# Patient Record
Sex: Female | Born: 1960 | Race: White | Hispanic: No | Marital: Married | Smoking: Never smoker
Health system: Southern US, Community
[De-identification: ages and names within clinical notes are randomized; demographics above are authoritative.]

---
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
## 2000-06-19 ENCOUNTER — Inpatient Hospital Stay (HOSPITAL_COMMUNITY): Admission: AD | Admit: 2000-06-19 | Discharge: 2000-06-22 | Payer: Self-pay | Admitting: Obstetrics and Gynecology

## 2000-07-01 ENCOUNTER — Encounter: Admission: RE | Admit: 2000-07-01 | Discharge: 2000-07-31 | Payer: Self-pay | Admitting: Obstetrics and Gynecology

## 2000-07-31 ENCOUNTER — Other Ambulatory Visit: Admission: RE | Admit: 2000-07-31 | Discharge: 2000-07-31 | Payer: Self-pay | Admitting: Obstetrics and Gynecology

## 2001-10-16 ENCOUNTER — Other Ambulatory Visit: Admission: RE | Admit: 2001-10-16 | Discharge: 2001-10-16 | Payer: Self-pay | Admitting: Obstetrics and Gynecology

## 2002-11-13 ENCOUNTER — Other Ambulatory Visit: Admission: RE | Admit: 2002-11-13 | Discharge: 2002-11-13 | Payer: Self-pay | Admitting: Obstetrics and Gynecology

## 2003-02-11 ENCOUNTER — Other Ambulatory Visit: Admission: RE | Admit: 2003-02-11 | Discharge: 2003-02-11 | Payer: Self-pay | Admitting: Obstetrics and Gynecology

## 2003-09-09 ENCOUNTER — Other Ambulatory Visit: Admission: RE | Admit: 2003-09-09 | Discharge: 2003-09-09 | Payer: Self-pay | Admitting: Obstetrics and Gynecology

## 2003-12-21 ENCOUNTER — Other Ambulatory Visit: Admission: RE | Admit: 2003-12-21 | Discharge: 2003-12-21 | Payer: Self-pay | Admitting: Obstetrics and Gynecology

## 2004-07-05 ENCOUNTER — Other Ambulatory Visit: Admission: RE | Admit: 2004-07-05 | Discharge: 2004-07-05 | Payer: Self-pay | Admitting: Obstetrics and Gynecology

## 2005-01-15 ENCOUNTER — Other Ambulatory Visit: Admission: RE | Admit: 2005-01-15 | Discharge: 2005-01-15 | Payer: Self-pay | Admitting: Obstetrics and Gynecology

## 2006-02-05 ENCOUNTER — Encounter: Admission: RE | Admit: 2006-02-05 | Discharge: 2006-02-05 | Payer: Self-pay | Admitting: Family Medicine

## 2006-03-11 ENCOUNTER — Encounter: Admission: RE | Admit: 2006-03-11 | Discharge: 2006-03-11 | Payer: Self-pay | Admitting: Family Medicine

## 2008-01-02 HISTORY — PX: CHOLECYSTECTOMY: SHX55

## 2008-09-02 ENCOUNTER — Ambulatory Visit (HOSPITAL_COMMUNITY): Admission: RE | Admit: 2008-09-02 | Discharge: 2008-09-02 | Payer: Self-pay | Admitting: Family Medicine

## 2008-11-17 ENCOUNTER — Encounter: Admission: RE | Admit: 2008-11-17 | Discharge: 2008-11-17 | Payer: Self-pay | Admitting: Surgery

## 2008-11-24 ENCOUNTER — Encounter (INDEPENDENT_AMBULATORY_CARE_PROVIDER_SITE_OTHER): Payer: Self-pay | Admitting: *Deleted

## 2008-12-02 ENCOUNTER — Encounter: Admission: RE | Admit: 2008-12-02 | Discharge: 2008-12-02 | Payer: Self-pay | Admitting: Gastroenterology

## 2009-03-18 ENCOUNTER — Encounter: Payer: Self-pay | Admitting: Internal Medicine

## 2009-06-06 ENCOUNTER — Ambulatory Visit: Payer: Self-pay | Admitting: Internal Medicine

## 2009-06-06 DIAGNOSIS — R000 Tachycardia, unspecified: Secondary | ICD-10-CM | POA: Insufficient documentation

## 2009-06-29 ENCOUNTER — Ambulatory Visit (HOSPITAL_COMMUNITY): Admission: RE | Admit: 2009-06-29 | Discharge: 2009-06-29 | Payer: Self-pay | Admitting: Internal Medicine

## 2009-06-29 ENCOUNTER — Ambulatory Visit: Payer: Self-pay

## 2009-06-29 ENCOUNTER — Ambulatory Visit: Payer: Self-pay | Admitting: Cardiology

## 2009-09-02 ENCOUNTER — Ambulatory Visit: Payer: Self-pay | Admitting: Vascular Surgery

## 2009-09-02 ENCOUNTER — Ambulatory Visit (HOSPITAL_COMMUNITY): Admission: RE | Admit: 2009-09-02 | Discharge: 2009-09-02 | Payer: Self-pay | Admitting: Physician Assistant

## 2009-09-07 ENCOUNTER — Telehealth: Payer: Self-pay | Admitting: Internal Medicine

## 2010-01-31 NOTE — Progress Notes (Signed)
Summary: [facility] Office Visit Note   [facility] Office Visit Note   Imported By: Roderic Ovens 07/18/2009 16:13:52  _____________________________________________________________________  External Attachment:    Type:   Image     Comment:   External Document

## 2010-01-31 NOTE — Assessment & Plan Note (Signed)
Summary: nep/tachycardia   Visit Type:  Initial Consult  CC:  new patient with tachycardia.  Marland Kitchen  History of Present Illness: Erica Robbins is a pleasant 50 yo WF without significant cardiac history who now presents for cardiology consultation regarding tachycardia.  She reports having heart rates 100-105 bpm in December with associated mild hypertension.  She denies changes in diet, medicines, or any known precipitants.  She had resoluction of tachcyardia/ hypertension until later march/ early april when she again had HR 90s-110s.  She reports rare heart "pounding" but denies other symptoms.  Since mid April, her heart rates have returned to normal.  The patient denies symptoms of  chest pain, shortness of breath, orthopnea, PND, lower extremity edema, dizziness, presyncope, syncope, or neurologic sequela. The patient is tolerating medications without difficulties and is otherwise without complaint today.   Current Medications (verified): 1)  One-A-Day Weight Smart Advance  Tabs (Multiple Vitamins-Minerals) .... Take One Tablet Once Daily 2)  Caltrate 600+d 600-400 Mg-Unit Tabs (Calcium Carbonate-Vitamin D) .... Take One Tablet Once Daily 3)  Buspirone Hcl 10 Mg Tabs (Buspirone Hcl) .... Take One Tablet Two Times A Day As Needed For Heart Rate 4)  Culturelle Digestive Health  Caps (Lactobacillus-Inulin) .... Once Daily 5)  Femhrt Low Dose 0.5-2.5 Mg-Mcg Tabs (Norethindrone-Eth Estradiol) .... Take One Tablet By Mouth Once Daily  Allergies (verified): 1)  ! Sulfa  Past History:  Past Medical History: menopause allergies  Past Surgical History: cholecystectomy  Family History: father had CABG age 32s,  CVA/CVD mother has HTN  Social History: lives in [place].  Denies TED  Review of Systems       All systems are reviewed and negative except as listed in the HPI.   Vital Signs:  Patient profile:   50 year old female Height:      62 inches Weight:      110 pounds BMI:      20.19 Pulse rate:   80 / minute Pulse rhythm:   regular BP sitting:   112 / 76  (left arm) Cuff size:   regular  Vitals Entered By: Judithe Modest CMA (June 06, 2009 10:58 AM)  Physical Exam  General:  Well developed, well nourished, in no acute distress. Head:  normocephalic and atraumatic Eyes:  PERRLA/EOM intact; conjunctiva and lids normal. Nose:  no deformity, discharge, inflammation, or lesions Mouth:  Teeth, gums and palate normal. Oral mucosa normal. Neck:  Neck supple, no JVD. No masses, thyromegaly or abnormal cervical nodes. Lungs:  Clear bilaterally to auscultation and percussion. Heart:  Non-displaced PMI, chest non-tender; regular rate and rhythm, S1, S2 without murmurs, rubs or gallops. Carotid upstroke normal, no bruit. Normal abdominal aortic size, no bruits. Femorals normal pulses, no bruits. Pedals normal pulses. No edema, no varicosities. Abdomen:  Bowel sounds positive; abdomen soft and non-tender without masses, organomegaly, or hernias noted. No hepatosplenomegaly. Msk:  Back normal, normal gait. Muscle strength and tone normal. Pulses:  pulses normal in all 4 extremities Extremities:  No clubbing or cyanosis. Neurologic:  Alert and oriented x 3. Skin:  Intact without lesions or rashes. Cervical Nodes:  no significant adenopathy Psych:  mildly anxious   EKG  Procedure date:  06/06/2009  Findings:      sinus rhythm 80 bpm, PR 114, RsR', LAD, otherwise normal ekg  Impression & Recommendations:  Problem # 1:  UNSPECIFIED TACHYCARDIA (ICD-785.0) The patient presents for evaluation of mild sinus tachycardia and elevated BP.  These findings were present  in December after the second stroke of her father and associated with "stress".  She also reports similar findings in early march. Presently, her EKG reveals sinus rhythm.  I suspect that she has reactive/ situational sinus tachycardia.  Her history is not suggestive of an arrhythmia. I have reviewed CBC, TSH,  and Lipids from PCP's office which are normal. She will maintain adequate hydration and begin regular exercise We will obtain an echocardiogram to evaluate for structural heart disease.  No further workup is planned unless symptoms return or echo is abnl.  Other Orders: Echocardiogram (Echo)  Patient Instructions: 1)  Your physician recommends that you schedule a follow-up appointment in: as needed if echo normal 2)  Your physician has requested that you have an echocardiogram.  Echocardiography is a painless test that uses sound waves to create images of your heart. It provides your doctor with information about the size and shape of your heart and how well your heart's chambers and valves are working.  This procedure takes approximately one hour. There are no restrictions for this procedure.

## 2010-01-31 NOTE — Progress Notes (Signed)
Summary: c/o b/p / pulse x 3 weeks  Phone Note Call from Patient Call back at [facility] Phone [phone] Call back at Work Phone [phone]   Caller: Patient Reason for Call: Talk to Nurse Summary of Call: c/o spike in blood pressure/ pulse x 3 weeks. pt states pulse is more concerning than b/p . pulse today 99 @ 6 .m. pulse over the weekend up to 113. pt was advise that i would send an important message to nurse.  Initial call taken by: Lorne Skeens,  September 07, 2009 10:51 AM  Follow-up for Phone Call        states that DrAllred said if her HR got over 110 to ler him know 8/19 had a spike of HR 85 BP 145/85 9/01140/75 HR 91 spikes every night pulse running 80's-90's later 70's -80's BP  Went to see her primary doctor and he gave her Bisoprolol to take at night.  These episodes wake her up at night "pounding through the matress" The Bisoprolol is 2.5mg  nightly.  We will let her continue you to follow with her primary doctor.  She also stated it could be her anxiety. She will follow up with me in a few weeks after the meds have had time to work Dennis Bast, RN, BSN  September 08, 2009 4:36 PM

## 2010-05-19 NOTE — H&P (Signed)
[facility]  Patient:    Erica Robbins, Erica Robbins                       MRN: [id number] Adm. Date:  06/19/00 Attending:  Freddy Finner, M.D.                         History and Physical  ADMISSION DIAGNOSES:          1. Intrauterine pregnancy at term.                               2. Mild late onset pregnancy induced                                  hypertension.  HISTORY OF PRESENT ILLNESS:   The patient is a 50 year old married white female essential primigravida who has been followed through and essentially uneventful prenatal course until approximately 2-3 days prior to this admission, at which time she was first noted to have increasing blood pressure and complained of dizziness.  She subsequently had PIH panel done which showed a uric acid of 7, but other parameters normal other than platelets a little on the low side at 147,000.  She has no CNS or GI symptoms.  Her reflexes are +@ with no clonus.  Given the fact that she is 40-1/7 weeks and with her pressure at 138/78, we have elected to admit her for induction.  PAST MEDICAL HISTORY:         As recorded and detailed in the preoperative summary and will not be repeated.  REVIEW OF SYSTEMS:            Negative except as noted above.  PHYSICAL EXAMINATION:  HEENT:                        Grossly normal.  VITAL SIGNS:                  Blood pressure 138/78.  NEUROLOGIC:                   Deep tendon reflexes +2 with no clonus.  HEART:                        Normal sinus rhythm without murmurs, rubs, or gallops.  LUNGS:                        Clear to auscultation.  ABDOMEN:                      Gravid.  There is no right upper quadrant or epigastric tenderness.  Estimated fetal weight of 7 lb.  PELVIC:                       The cervix is soft, but only 25% effaced, 1.5 dilated at the internal os.  The vertex is floating and a -3.  EXTREMITIES:                  No significant  edema.  ASSESSMENT:                   1. Intrauterine pregnancy.  2. Mild late pregnancy induced hypertension.                               3. Advanced maternal age.  PLAN:                         Induction of labor. DD:  06/19/00 TD:  06/19/00 Job: [id number]

## 2010-05-19 NOTE — Op Note (Signed)
[facility]  Patient:    Erica Robbins, Erica Robbins                       MRN: [id number] Proc. Date: 06/20/00 Attending:  Trevor Iha, M.D.                           Operative Report  OBSTETRICIAN:                 Trevor Iha, M.D.  DESCRIPTION OF PROCEDURE:     The patient has been pushing for approximately 2-1/2 hours.  The fetus was at a +3 to +4 station, left occiput anterior presentation.  Due to maternal exhaustion, the patient desired vacuum extractor.  We discussed the risks and benefits at length including the 1 out of 50,000 subgaleal bleed.  She gave her informed consent.  The mushroom Mityvac was placed.  On the second pull with a second degree midline episiotomy, easily delivered the infants vertex.  The nares and pharynx were DeLee suctioned due to meconium.  The infant was then easily delivered.  THe cord was clamped and the infant was placed on the patients abdomen with good cry noted.  Apgars were 8 and 9.  Viable female infant.  The placenta delivered spontaneously.  Partial third degree extension was noted.  The third degree was repaired with 0 chromic in the usual fashion.  The second degree was closed with 2-0 chromic in a running suture and several small interrupted sutures on the perineal body.  Rectal exam revealed good tone and noted good repair of the episiotomy and extension.  Manual exam of the uterus retrieved several small clots.  It was noted to be empty after the delivery.  The patient and the baby were doing very well.  Estimated blood loss for the procedure was 400 cc. DD:  06/20/00 TD:  06/20/00 Job: [id number]

## 2012-02-22 ENCOUNTER — Other Ambulatory Visit: Payer: Self-pay | Admitting: Gastroenterology

## 2012-02-22 DIAGNOSIS — R109 Unspecified abdominal pain: Secondary | ICD-10-CM

## 2012-02-22 DIAGNOSIS — R7989 Other specified abnormal findings of blood chemistry: Secondary | ICD-10-CM

## 2012-02-26 ENCOUNTER — Ambulatory Visit
Admission: RE | Admit: 2012-02-26 | Discharge: 2012-02-26 | Disposition: A | Discharge status: Home / Self Care | Payer: BC Managed Care – PPO | Source: Ambulatory Visit | Attending: Gastroenterology | Admitting: Gastroenterology

## 2012-02-26 DIAGNOSIS — R7989 Other specified abnormal findings of blood chemistry: Secondary | ICD-10-CM

## 2012-02-26 DIAGNOSIS — R109 Unspecified abdominal pain: Secondary | ICD-10-CM

## 2012-02-26 MED ORDER — IOHEXOL 300 MG/ML  SOLN
100.0000 mL | Freq: Once | INTRAMUSCULAR | Status: AC | PRN
  Administered 2012-02-26: 100 mL via INTRAVENOUS

## 2012-03-28 ENCOUNTER — Other Ambulatory Visit: Payer: Self-pay | Admitting: Obstetrics and Gynecology

## 2012-03-28 DIAGNOSIS — N6315 Unspecified lump in the right breast, overlapping quadrants: Secondary | ICD-10-CM

## 2012-04-08 ENCOUNTER — Ambulatory Visit
Admission: RE | Admit: 2012-04-08 | Discharge: 2012-04-08 | Disposition: A | Discharge status: Home / Self Care | Payer: BC Managed Care – PPO | Source: Ambulatory Visit | Attending: Obstetrics and Gynecology | Admitting: Obstetrics and Gynecology

## 2012-04-08 DIAGNOSIS — N6315 Unspecified lump in the right breast, overlapping quadrants: Secondary | ICD-10-CM

## 2014-05-28 ENCOUNTER — Ambulatory Visit
Admission: RE | Admit: 2014-05-28 | Discharge: 2014-05-28 | Disposition: A | Discharge status: Home / Self Care | Payer: 59 | Source: Ambulatory Visit | Attending: Physician Assistant | Admitting: Physician Assistant

## 2014-05-28 ENCOUNTER — Other Ambulatory Visit: Payer: Self-pay | Admitting: Physician Assistant

## 2014-05-28 DIAGNOSIS — M5416 Radiculopathy, lumbar region: Secondary | ICD-10-CM

## 2014-09-29 ENCOUNTER — Ambulatory Visit (INDEPENDENT_AMBULATORY_CARE_PROVIDER_SITE_OTHER): Payer: 59 | Admitting: Neurology

## 2014-09-29 ENCOUNTER — Encounter: Payer: Self-pay | Admitting: Neurology

## 2014-09-29 VITALS — BP 109/66 | HR 67 | Ht 62.0 in | Wt 120.0 lb

## 2014-09-29 DIAGNOSIS — R202 Paresthesia of skin: Secondary | ICD-10-CM

## 2014-09-29 DIAGNOSIS — G35 Multiple sclerosis: Secondary | ICD-10-CM

## 2014-09-29 NOTE — Progress Notes (Addendum)
[facility]    Provider:  Dr Jaynee Eagles Referring Provider: Corine Shelter, PA-C Primary Care Physician:  Beatris Si  CC:  "prickles"  HPI:  Erica Robbins is a very nice 54 y.o. female here as a referral from Dr. Aron Baba for "prickles" as she calls them, like a bee stinging or a little bug sting. No significant PMHx, a review of notes from PCP state previous episodic dizziness with eustacian tube dysfunction, allergies, cdiff, otherwise nothing significant. Like a little pin pricking her, one or several in a row, fraction of a second. Happens all over her body, her legs, her trunk, her arms, face, back, neck, stomach, feet - can be anywhere. Symptoms started in July. She hurt her back in April and was in PT in July.  This is when the symptoms started. They wax and wane, sometimes none in one day other times could be randomly throughout the day. No trigger, no inciting events, unknown. No new medications exceot some flonase for allergies. No headaches, diplopia, ptosis, weakness, sob, fevers, light sensitivity or other associated symptoms. She has neck pain. She sits for long periods of time and associates her neck pain with her work. No FHx of MS or autoimmune disorders. She has intense itching in her feet and ankles a few years ago and she had the prickles then as well which was worse and it went away after a few days. No previous illnesses to the symptoms. No other focal neurologic deficits. She had blood work, a physical back in March. No history of neuropathy or neurodegenerative disease except grandmother with PD. No sensory loss.   Reviewed notes, labs and imaging from outside physicians, which showed:  XR Lumbar spine: personally reviewed images, agree with below  There is no evidence of lumbar spine fracture. Alignment is normal. Intervertebral disc spaces are maintained.  IMPRESSION: Negative.    Review of Systems: Patient complains of symptoms per HPI as well  as the following symptoms: feeling hot, allergies. Pertinent negatives per HPI. All others negative.   Social History   Social History  . Marital Status: Married    Spouse Name: Nicole Kindred  . Number of Children: 1  . Years of Education: College   Occupational History  .  Other    [employer]   Social History Main Topics  . Smoking status: Never Smoker   . Smokeless tobacco: Never Used  . Alcohol Use: 0.0 oz/week    0 Standard drinks or equivalent per week     Comment: occasional  . Drug Use: No  . Sexual Activity: Not on file   Other Topics Concern  . Not on file   Social History Narrative   Patient lives at home with family.   Caffeine Use: quit in 2001    Family History  Problem Relation Age of Onset  . Hypertension Mother   . Hyperlipidemia Mother     triglycerides  . Gallbladder disease Mother   . Hypertension Father   . Heart Problems Father     quad bypass  . Depression Father   . Depression Paternal Grandmother   . Parkinson's disease Paternal Grandmother     History reviewed. No pertinent past medical history.  Past Surgical History  Procedure Laterality Date  . Rhinoplasty  1980  . Cholecystectomy  2010    Current Outpatient Prescriptions  Medication Sig Dispense Refill  . bisoprolol (ZEBETA) 5 MG tablet Take 0.5 tablets by mouth daily.  5  . fluticasone (FLONASE) 50 MCG/ACT  nasal spray Place 1 spray into both nostrils daily as needed.     . loratadine (CLARITIN) 10 MG tablet Take 10 mg by mouth daily.    . Multiple Vitamins-Minerals (WOMENS ONE DAILY PO) Take 1 tablet by mouth daily.    Marland Kitchen saccharomyces boulardii (FLORASTOR) 250 MG capsule Take 250 mg by mouth 2 (two) times daily.    Marland Kitchen VANIQA 13.9 % cream Apply 1 application topically 2 (two) times daily.  4   No current facility-administered medications for this visit.    Allergies as of 09/29/2014 - Review Complete 09/29/2014  Allergen Reaction Noted  . Sulfonamide derivatives       Vitals: BP 109/66 mmHg  Pulse 67  Ht 5\' 2"  (1.575 m)  Wt 120 lb (54.432 kg)  BMI 21.94 kg/m2 Last Weight:  Wt Readings from Last 1 Encounters:  09/29/14 120 lb (54.432 kg)   Last Height:   Ht Readings from Last 1 Encounters:  09/29/14 5\' 2"  (1.575 m)   Physical exam: Exam: Gen: NAD, conversant, well nourised, well groomed                     CV: RRR, no MRG. No Carotid Bruits. No peripheral edema, warm, nontender Eyes: Conjunctivae clear without exudates or hemorrhage  Neuro: Detailed Neurologic Exam  Speech:    Speech is normal; fluent and spontaneous with normal comprehension.  Cognition:    The patient is oriented to person, place, and time;     recent and remote memory intact;     language fluent;     normal attention, concentration,     fund of knowledge Cranial Nerves:    The pupils are equal, round, and reactive to light. The fundi are normal and spontaneous venous pulsations are present. Visual fields are full to finger confrontation. Extraocular movements are intact. Trigeminal sensation is intact and the muscles of mastication are normal. The face is symmetric. The palate elevates in the midline. Hearing intact. Voice is normal. Shoulder shrug is normal. The tongue has normal motion without fasciculations.   Coordination:    Normal finger to nose and heel to shin. Normal rapid alternating movements.   Gait:    Heel-toe and tandem gait are normal.   Motor Observation:    No asymmetry, no atrophy, and no involuntary movements noted. Tone:    Normal muscle tone.    Posture:    Posture is normal. normal erect    Strength:    Strength is V/V in the upper and lower limbs.      Sensation: intact to LT     Reflex Exam:  DTR's:    Deep tendon reflexes in the upper and lower extremities are normal bilaterally.   Toes:    The toes are downgoing bilaterally.   Clonus:    Clonus is absent.   CC: Helper, Mark    Assessment/Plan:  This is a very nice  54 year old with occ. Body paresthesias of uncertain etiology. Will order a serum neuropathy panel and recommend MRI of the brain and cervical cord as well as an emg/ncs.   Sarina Ill, MD  [facility] [address] [place], [place] [address]  Phone [phone] Fax [phone]

## 2014-09-29 NOTE — Patient Instructions (Signed)
Overall you are doing fairly well but I do want to suggest a few things today:   Remember to drink plenty of fluid, eat healthy meals and do not skip any meals. Try to eat protein with a every meal and eat a healthy snack such as fruit or nuts in between meals. Try to keep a regular sleep-wake schedule and try to exercise daily, particularly in the form of walking, 20-30 minutes a day, if you can.    As far as diagnostic testing: EMG/NCS, MRi of the brain and cervical cord, Labs  I would like to see you back as needed, sooner if we need to. Please call us with any interim questions, concerns, problems, updates or refill requests.   Our phone number is [phone]. We also have an after hours call service for urgent matters and there is a physician on-call for urgent questions. For any emergencies you know to call 911 or go to the nearest emergency room

## 2014-10-01 ENCOUNTER — Encounter: Payer: Self-pay | Admitting: Neurology

## 2014-10-01 DIAGNOSIS — R202 Paresthesia of skin: Secondary | ICD-10-CM | POA: Insufficient documentation

## 2014-10-03 LAB — SEDIMENTATION RATE: SED RATE: 2 mm/h (ref 0–40)

## 2014-10-03 LAB — MULTIPLE MYELOMA PANEL, SERUM
ALBUMIN SERPL ELPH-MCNC: 4 g/dL (ref 2.9–4.4)
ALBUMIN/GLOB SERPL: 1.7 (ref 0.7–1.7)
Alpha 1: 0.2 g/dL (ref 0.0–0.4)
Alpha2 Glob SerPl Elph-Mcnc: 0.6 g/dL (ref 0.4–1.0)
B-GLOBULIN SERPL ELPH-MCNC: 1.1 g/dL (ref 0.7–1.3)
GAMMA GLOB SERPL ELPH-MCNC: 0.6 g/dL (ref 0.4–1.8)
Globulin, Total: 2.5 g/dL (ref 2.2–3.9)
IGG (IMMUNOGLOBIN G), SERUM: 690 mg/dL — AB (ref 700–1600)
IgA/Immunoglobulin A, Serum: 187 mg/dL (ref 87–352)
IgM (Immunoglobulin M), Srm: 51 mg/dL (ref 26–217)

## 2014-10-03 LAB — COMPREHENSIVE METABOLIC PANEL
A/G RATIO: 2.3 (ref 1.1–2.5)
ALK PHOS: 69 IU/L (ref 39–117)
ALT: 20 IU/L (ref 0–32)
AST: 20 IU/L (ref 0–40)
Albumin: 4.5 g/dL (ref 3.5–5.5)
BILIRUBIN TOTAL: 0.5 mg/dL (ref 0.0–1.2)
BUN/Creatinine Ratio: 19 (ref 9–23)
BUN: 15 mg/dL (ref 6–24)
CHLORIDE: 101 mmol/L (ref 97–108)
CO2: 27 mmol/L (ref 18–29)
Calcium: 9.4 mg/dL (ref 8.7–10.2)
Creatinine, Ser: 0.78 mg/dL (ref 0.57–1.00)
GFR calc Af Amer: 100 mL/min/{1.73_m2} (ref >59)
GFR calc non Af Amer: 86 mL/min/{1.73_m2} (ref >59)
Globulin, Total: 2 g/dL (ref 1.5–4.5)
Glucose: 99 mg/dL (ref 65–99)
POTASSIUM: 4.1 mmol/L (ref 3.5–5.2)
Sodium: 143 mmol/L (ref 134–144)
Total Protein: 6.5 g/dL (ref 6.0–8.5)

## 2014-10-03 LAB — CBC
HEMOGLOBIN: 14.1 g/dL (ref 11.1–15.9)
Hematocrit: 41.3 % (ref 34.0–46.6)
MCH: 30.1 pg (ref 26.6–33.0)
MCHC: 34.1 g/dL (ref 31.5–35.7)
MCV: 88 fL (ref 79–97)
Platelets: 230 10*3/uL (ref 150–379)
RBC: 4.68 x10E6/uL (ref 3.77–5.28)
RDW: 13.2 % (ref 12.3–15.4)
WBC: 5.8 10*3/uL (ref 3.4–10.8)

## 2014-10-03 LAB — TSH: TSH: 2.59 u[IU]/mL (ref 0.450–4.500)

## 2014-10-03 LAB — B12 AND FOLATE PANEL
Folate: >20 ng/mL (ref >3.0)
Vitamin B-12: 657 pg/mL (ref 211–946)

## 2014-10-03 LAB — HIV ANTIBODY (ROUTINE TESTING W REFLEX): HIV Screen 4th Generation wRfx: NONREACTIVE

## 2014-10-03 LAB — RPR: RPR: NONREACTIVE

## 2014-10-03 LAB — VITAMIN B6: Vitamin B6: 97.4 ug/L — ABNORMAL HIGH (ref 2.0–32.8)

## 2014-10-03 LAB — METHYLMALONIC ACID, SERUM: METHYLMALONIC ACID: 199 nmol/L (ref 0–378)

## 2014-10-03 LAB — ANA W/REFLEX: Anti Nuclear Antibody(ANA): NEGATIVE

## 2014-10-03 LAB — RHEUMATOID FACTOR

## 2014-10-03 LAB — VITAMIN B1: THIAMINE: 184.2 nmol/L (ref 66.5–200.0)

## 2014-10-04 ENCOUNTER — Telehealth: Payer: Self-pay | Admitting: *Deleted

## 2014-10-04 NOTE — Telephone Encounter (Signed)
LMVM for to on mobile to return call for lab results.

## 2014-10-05 NOTE — Telephone Encounter (Signed)
Pt called office back to discuss lab results. Advised labs were normal except for Vitamin B6 per Dr. Jaynee Eagles. Dr. Jaynee Eagles said "B6 was a bit on the high side. I f you take too much B6 you can get a toxicity. If she is just taking a multivitamin with B6 that is totally fine. But if she is taking additioinal B6 I would stop it. Cases of peripheral neuropathy, dermatoses, photosensitivity, dizziness, and nausea have been reported with long-term megadoses of pyridoxine over 250 mg/day; a few cases of neuropathy appear to have been caused by chronic intake of 100 to 200 mg/day The RDA of pyridoxine is 1.3 mg daily for younger men and women, and rises to 1.7 mg daily for men older than 50 years, and 1.5 mg daily for women older than 50 years." Pt stated she only takes a one a day women's vitamin and no other supplementation for Vitamin B6. Advised that she check her daily vitamin to see how much B6 she is getting. Also advised her to watch her diet. Foods high in vitamin B6 include rice, pistachio nuts, fish (tuna, cooked), liver (beef, cooked), ect. She can f/u with her PCP. She verbalized understanding. I told her I will fax results back to her referring provider. She is also going to sign up for mychart to see results there as well. Advised they will be released to her automatically and she can always call the help line listed on AVS from her visit or our office for help. She verbalized understanding.

## 2014-10-05 NOTE — Telephone Encounter (Signed)
Patient returned your call but stated she is on jury duty today and will call back tomorrow.  Thanks!

## 2014-10-05 NOTE — Telephone Encounter (Signed)
LVM for pt to call about lab results. Gave [facility] phone number and office hours.

## 2014-10-05 NOTE — Telephone Encounter (Signed)
-----   Message from Melvenia Beam, MD sent at 10/04/2014 11:05 AM EDT ----- Terrence Dupont - let patient know her labs were normal except her B6 was a bit on the high side. I f you take too much B6 you can get a toxicity. If she is just taking a multivitamin with B6 that is totally fine. But if she is taking additioinal B6 I would stop it. Cases of peripheral neuropathy, dermatoses, photosensitivity, dizziness, and nausea have been reported with long-term megadoses of pyridoxine over 250 mg/day; a few cases of neuropathy appear to have been caused by chronic intake of 100 to 200 mg/day The RDA of pyridoxine is 1.3 mg daily for younger men and women, and rises to 1.7 mg daily for men older than 50 years, and 1.5 mg daily for women older than 50 years.  Thanks

## 2016-05-21 DIAGNOSIS — D2272 Melanocytic nevi of left lower limb, including hip: Secondary | ICD-10-CM | POA: Diagnosis not present

## 2016-05-21 DIAGNOSIS — L918 Other hypertrophic disorders of the skin: Secondary | ICD-10-CM | POA: Diagnosis not present

## 2016-05-21 DIAGNOSIS — D225 Melanocytic nevi of trunk: Secondary | ICD-10-CM | POA: Diagnosis not present

## 2016-05-21 DIAGNOSIS — D2271 Melanocytic nevi of right lower limb, including hip: Secondary | ICD-10-CM | POA: Diagnosis not present

## 2016-05-21 DIAGNOSIS — D485 Neoplasm of uncertain behavior of skin: Secondary | ICD-10-CM | POA: Diagnosis not present

## 2016-07-13 DIAGNOSIS — Z131 Encounter for screening for diabetes mellitus: Secondary | ICD-10-CM | POA: Diagnosis not present

## 2016-07-13 DIAGNOSIS — Z136 Encounter for screening for cardiovascular disorders: Secondary | ICD-10-CM | POA: Diagnosis not present

## 2016-07-13 DIAGNOSIS — Z0000 Encounter for general adult medical examination without abnormal findings: Secondary | ICD-10-CM | POA: Diagnosis not present

## 2016-07-13 DIAGNOSIS — R002 Palpitations: Secondary | ICD-10-CM | POA: Diagnosis not present

## 2016-08-09 DIAGNOSIS — Z23 Encounter for immunization: Secondary | ICD-10-CM | POA: Diagnosis not present

## 2016-09-06 DIAGNOSIS — Z01419 Encounter for gynecological examination (general) (routine) without abnormal findings: Secondary | ICD-10-CM | POA: Diagnosis not present

## 2016-09-06 DIAGNOSIS — Z6824 Body mass index (BMI) 24.0-24.9, adult: Secondary | ICD-10-CM | POA: Diagnosis not present

## 2016-10-09 DIAGNOSIS — Z23 Encounter for immunization: Secondary | ICD-10-CM | POA: Diagnosis not present

## 2017-07-17 DIAGNOSIS — R221 Localized swelling, mass and lump, neck: Secondary | ICD-10-CM | POA: Diagnosis not present

## 2017-07-17 DIAGNOSIS — R002 Palpitations: Secondary | ICD-10-CM | POA: Diagnosis not present

## 2017-07-17 DIAGNOSIS — K589 Irritable bowel syndrome without diarrhea: Secondary | ICD-10-CM | POA: Diagnosis not present

## 2017-07-17 DIAGNOSIS — Z131 Encounter for screening for diabetes mellitus: Secondary | ICD-10-CM | POA: Diagnosis not present

## 2017-07-17 DIAGNOSIS — Z13220 Encounter for screening for lipoid disorders: Secondary | ICD-10-CM | POA: Diagnosis not present

## 2017-07-17 DIAGNOSIS — Z0000 Encounter for general adult medical examination without abnormal findings: Secondary | ICD-10-CM | POA: Diagnosis not present

## 2017-09-09 DIAGNOSIS — Z6824 Body mass index (BMI) 24.0-24.9, adult: Secondary | ICD-10-CM | POA: Diagnosis not present

## 2017-09-09 DIAGNOSIS — Z01419 Encounter for gynecological examination (general) (routine) without abnormal findings: Secondary | ICD-10-CM | POA: Diagnosis not present

## 2017-10-19 DIAGNOSIS — Z23 Encounter for immunization: Secondary | ICD-10-CM | POA: Diagnosis not present

## 2017-12-06 DIAGNOSIS — M8580 Other specified disorders of bone density and structure, unspecified site: Secondary | ICD-10-CM | POA: Diagnosis not present

## 2018-08-01 ENCOUNTER — Other Ambulatory Visit: Payer: Self-pay | Admitting: Physician Assistant

## 2018-08-01 DIAGNOSIS — R221 Localized swelling, mass and lump, neck: Secondary | ICD-10-CM

## 2018-09-09 ENCOUNTER — Ambulatory Visit
Admission: RE | Admit: 2018-09-09 | Discharge: 2018-09-09 | Disposition: A | Discharge status: Home / Self Care | Payer: 59 | Source: Ambulatory Visit | Attending: Physician Assistant | Admitting: Physician Assistant

## 2018-09-09 DIAGNOSIS — R221 Localized swelling, mass and lump, neck: Secondary | ICD-10-CM

## 2019-03-12 DIAGNOSIS — Z23 Encounter for immunization: Secondary | ICD-10-CM | POA: Diagnosis not present

## 2019-04-11 DIAGNOSIS — Z23 Encounter for immunization: Secondary | ICD-10-CM | POA: Diagnosis not present

## 2019-04-14 DIAGNOSIS — S1195XA Open bite of unspecified part of neck, initial encounter: Secondary | ICD-10-CM | POA: Diagnosis not present

## 2019-04-14 DIAGNOSIS — R21 Rash and other nonspecific skin eruption: Secondary | ICD-10-CM | POA: Diagnosis not present

## 2019-04-14 DIAGNOSIS — L03114 Cellulitis of left upper limb: Secondary | ICD-10-CM | POA: Diagnosis not present

## 2019-04-14 DIAGNOSIS — W57XXXA Bitten or stung by nonvenomous insect and other nonvenomous arthropods, initial encounter: Secondary | ICD-10-CM | POA: Diagnosis not present

## 2019-07-31 DIAGNOSIS — Z0000 Encounter for general adult medical examination without abnormal findings: Secondary | ICD-10-CM | POA: Diagnosis not present

## 2019-07-31 DIAGNOSIS — Z13220 Encounter for screening for lipoid disorders: Secondary | ICD-10-CM | POA: Diagnosis not present

## 2019-07-31 DIAGNOSIS — Z131 Encounter for screening for diabetes mellitus: Secondary | ICD-10-CM | POA: Diagnosis not present

## 2019-09-18 DIAGNOSIS — Z01419 Encounter for gynecological examination (general) (routine) without abnormal findings: Secondary | ICD-10-CM | POA: Diagnosis not present

## 2019-09-18 DIAGNOSIS — M8588 Other specified disorders of bone density and structure, other site: Secondary | ICD-10-CM | POA: Diagnosis not present

## 2019-09-18 DIAGNOSIS — Z6824 Body mass index (BMI) 24.0-24.9, adult: Secondary | ICD-10-CM | POA: Diagnosis not present

## 2019-09-18 DIAGNOSIS — N958 Other specified menopausal and perimenopausal disorders: Secondary | ICD-10-CM | POA: Diagnosis not present

## 2019-09-18 DIAGNOSIS — Z1231 Encounter for screening mammogram for malignant neoplasm of breast: Secondary | ICD-10-CM | POA: Diagnosis not present

## 2019-09-18 DIAGNOSIS — Z13820 Encounter for screening for osteoporosis: Secondary | ICD-10-CM | POA: Diagnosis not present

## 2019-09-24 DIAGNOSIS — D2239 Melanocytic nevi of other parts of face: Secondary | ICD-10-CM | POA: Diagnosis not present

## 2019-09-24 DIAGNOSIS — L821 Other seborrheic keratosis: Secondary | ICD-10-CM | POA: Diagnosis not present

## 2019-09-24 DIAGNOSIS — D225 Melanocytic nevi of trunk: Secondary | ICD-10-CM | POA: Diagnosis not present

## 2019-09-24 DIAGNOSIS — L814 Other melanin hyperpigmentation: Secondary | ICD-10-CM | POA: Diagnosis not present

## 2019-11-04 DIAGNOSIS — Z23 Encounter for immunization: Secondary | ICD-10-CM | POA: Diagnosis not present

## 2019-12-05 ENCOUNTER — Ambulatory Visit: Payer: Self-pay | Attending: Internal Medicine

## 2019-12-05 DIAGNOSIS — Z23 Encounter for immunization: Secondary | ICD-10-CM

## 2019-12-05 NOTE — Progress Notes (Signed)
   Covid-19 Vaccination Clinic  Name:  Erica Robbins    MRN: [id number] DOB: [birth year]  12/05/2019  [name] was observed post Covid-19 immunization for 30 minutes based on pre-vaccination screening without incident. She was provided with Vaccine Information Sheet and instruction to access the V-Safe system.   [name] was instructed to call 911 with any severe reactions post vaccine: Marland Kitchen Difficulty breathing  . Swelling of face and throat  . A fast heartbeat  . A bad rash all over body  . Dizziness and weakness   Immunizations Administered    No immunizations on file.

## 2020-02-18 DIAGNOSIS — D485 Neoplasm of uncertain behavior of skin: Secondary | ICD-10-CM | POA: Diagnosis not present

## 2020-02-18 DIAGNOSIS — D2261 Melanocytic nevi of right upper limb, including shoulder: Secondary | ICD-10-CM | POA: Diagnosis not present

## 2020-02-18 DIAGNOSIS — D2262 Melanocytic nevi of left upper limb, including shoulder: Secondary | ICD-10-CM | POA: Diagnosis not present

## 2020-02-18 DIAGNOSIS — L814 Other melanin hyperpigmentation: Secondary | ICD-10-CM | POA: Diagnosis not present

## 2020-02-18 DIAGNOSIS — L821 Other seborrheic keratosis: Secondary | ICD-10-CM | POA: Diagnosis not present

## 2020-02-18 DIAGNOSIS — D225 Melanocytic nevi of trunk: Secondary | ICD-10-CM | POA: Diagnosis not present

## 2020-08-16 DIAGNOSIS — Z0000 Encounter for general adult medical examination without abnormal findings: Secondary | ICD-10-CM | POA: Diagnosis not present

## 2020-08-16 DIAGNOSIS — Z131 Encounter for screening for diabetes mellitus: Secondary | ICD-10-CM | POA: Diagnosis not present

## 2020-08-16 DIAGNOSIS — M2550 Pain in unspecified joint: Secondary | ICD-10-CM | POA: Diagnosis not present

## 2020-08-16 DIAGNOSIS — Z13220 Encounter for screening for lipoid disorders: Secondary | ICD-10-CM | POA: Diagnosis not present

## 2020-09-26 DIAGNOSIS — Z0000 Encounter for general adult medical examination without abnormal findings: Secondary | ICD-10-CM | POA: Diagnosis not present

## 2020-09-26 DIAGNOSIS — Z1231 Encounter for screening mammogram for malignant neoplasm of breast: Secondary | ICD-10-CM | POA: Diagnosis not present

## 2020-09-26 DIAGNOSIS — Z01419 Encounter for gynecological examination (general) (routine) without abnormal findings: Secondary | ICD-10-CM | POA: Diagnosis not present

## 2020-09-26 DIAGNOSIS — Z6824 Body mass index (BMI) 24.0-24.9, adult: Secondary | ICD-10-CM | POA: Diagnosis not present

## 2020-09-26 DIAGNOSIS — L659 Nonscarring hair loss, unspecified: Secondary | ICD-10-CM | POA: Diagnosis not present

## 2020-10-19 DIAGNOSIS — Z23 Encounter for immunization: Secondary | ICD-10-CM | POA: Diagnosis not present

## 2021-02-09 DIAGNOSIS — D225 Melanocytic nevi of trunk: Secondary | ICD-10-CM | POA: Diagnosis not present

## 2021-02-09 DIAGNOSIS — B078 Other viral warts: Secondary | ICD-10-CM | POA: Diagnosis not present

## 2021-02-09 DIAGNOSIS — D2272 Melanocytic nevi of left lower limb, including hip: Secondary | ICD-10-CM | POA: Diagnosis not present

## 2021-02-09 DIAGNOSIS — D2261 Melanocytic nevi of right upper limb, including shoulder: Secondary | ICD-10-CM | POA: Diagnosis not present

## 2021-02-09 DIAGNOSIS — D2262 Melanocytic nevi of left upper limb, including shoulder: Secondary | ICD-10-CM | POA: Diagnosis not present

## 2021-03-22 DIAGNOSIS — L821 Other seborrheic keratosis: Secondary | ICD-10-CM | POA: Diagnosis not present

## 2021-05-12 IMAGING — US US SOFT TISSUE HEAD/NECK
1 series · 12 of 12 positions shown · non-contrast
Comparison: None.

CLINICAL DATA: 58-year-old female with neck mass

EXAM:
ULTRASOUND OF HEAD/NECK SOFT TISSUES
TECHNIQUE: Ultrasound examination of the head and neck soft tissues was
performed in the area of clinical concern.

[Series 1: us soft tissue head/neck · 0.04mm/px · 12 of 12 slices shown]
[im 1/12]
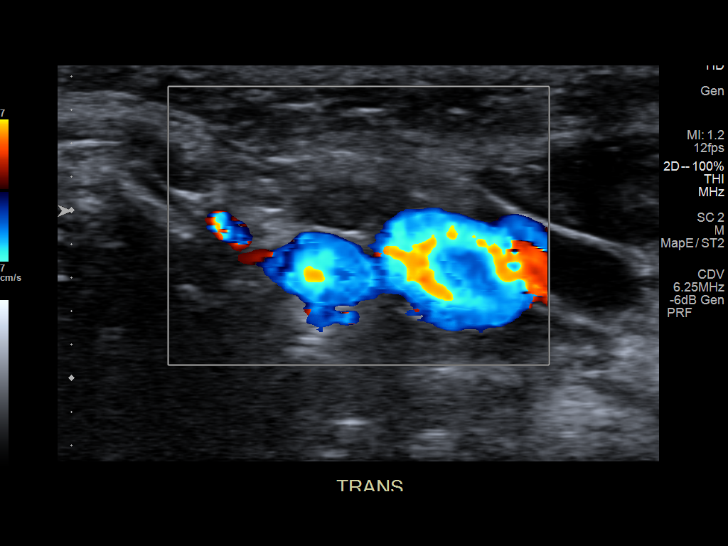
[im 2/12]
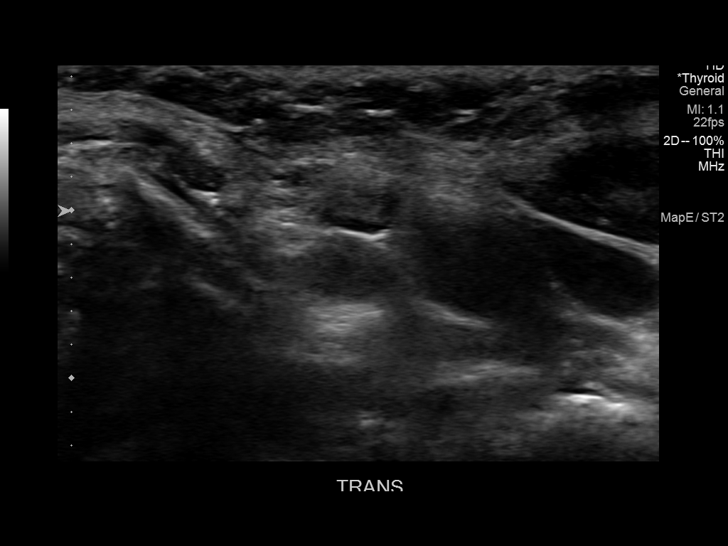
[im 3/12]
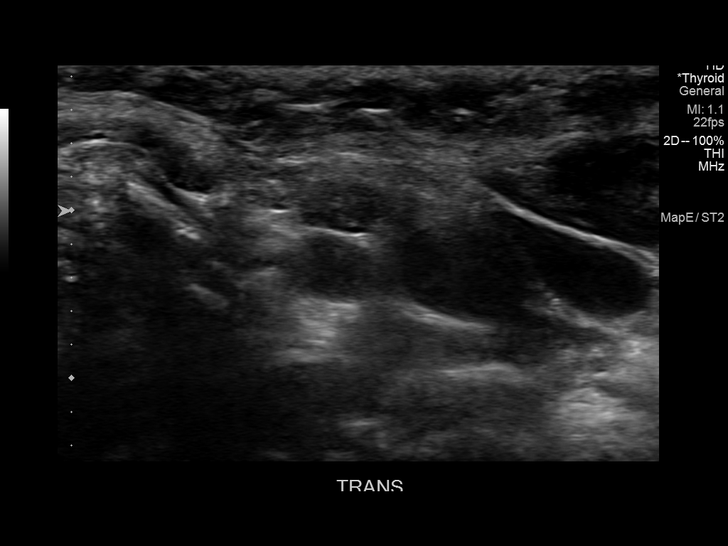
[im 4/12]
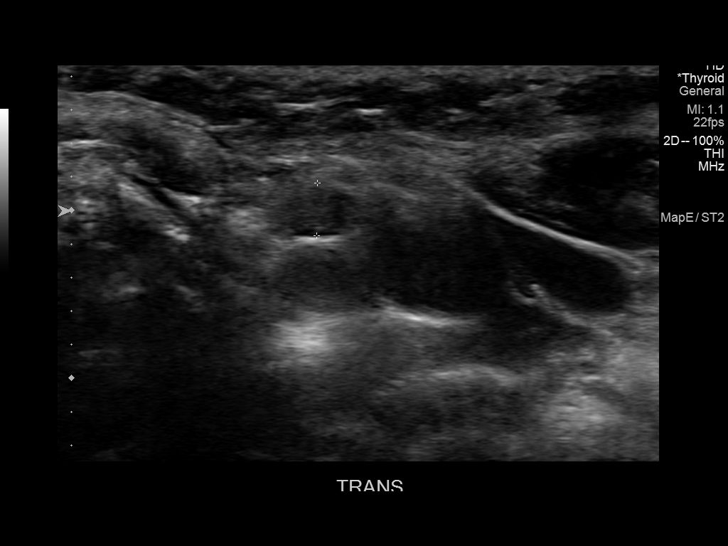
[im 5/12]
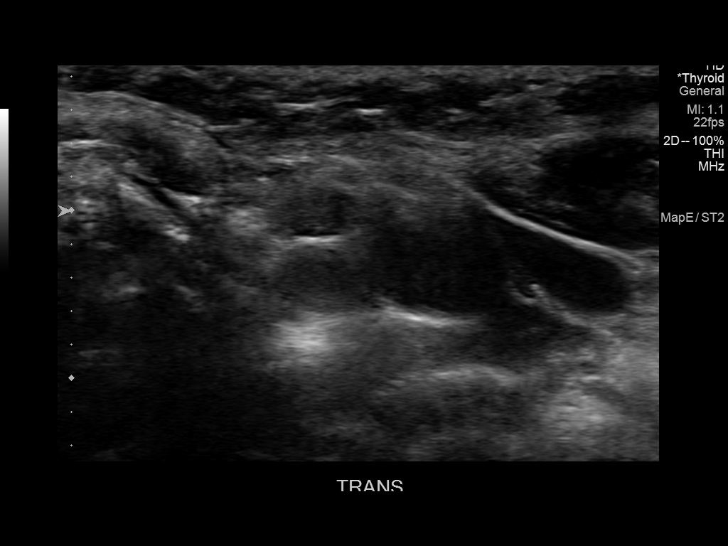
[im 6/12]
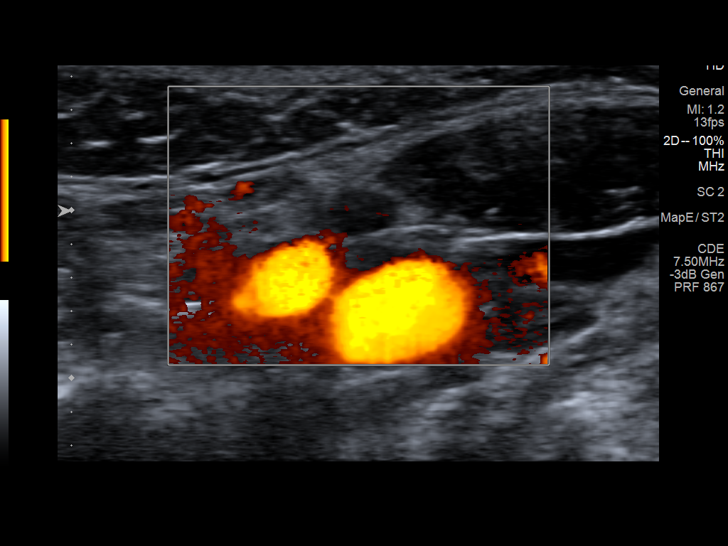
[im 7/12]
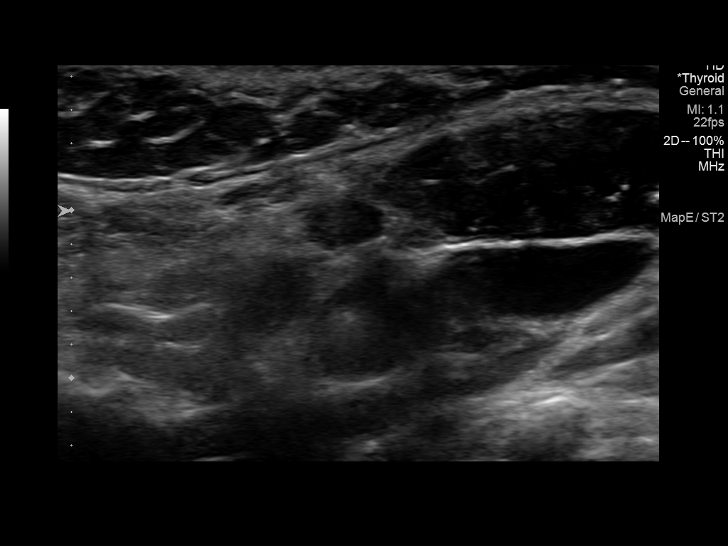
[im 8/12]
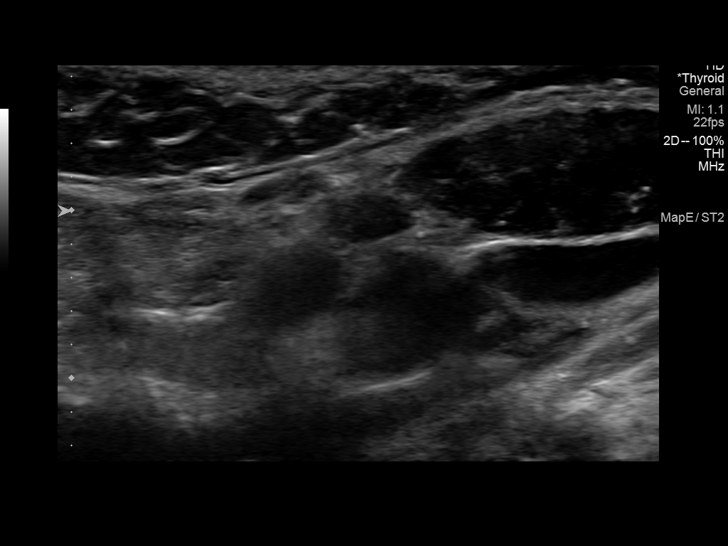
[im 9/12]
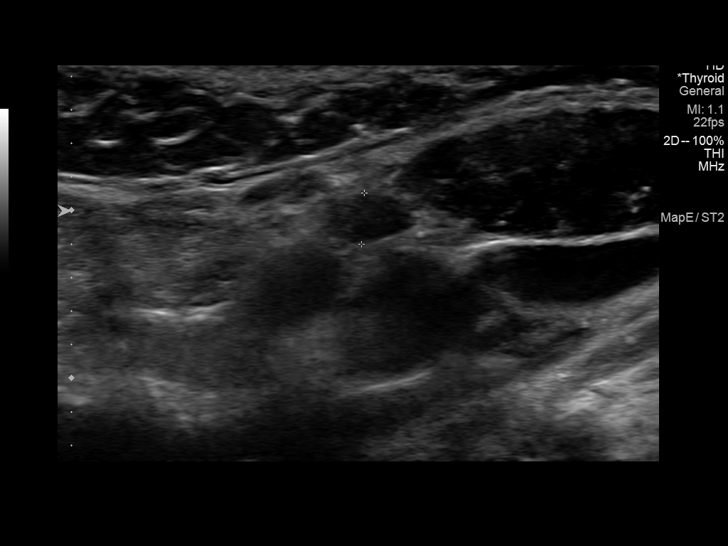
[im 10/12]
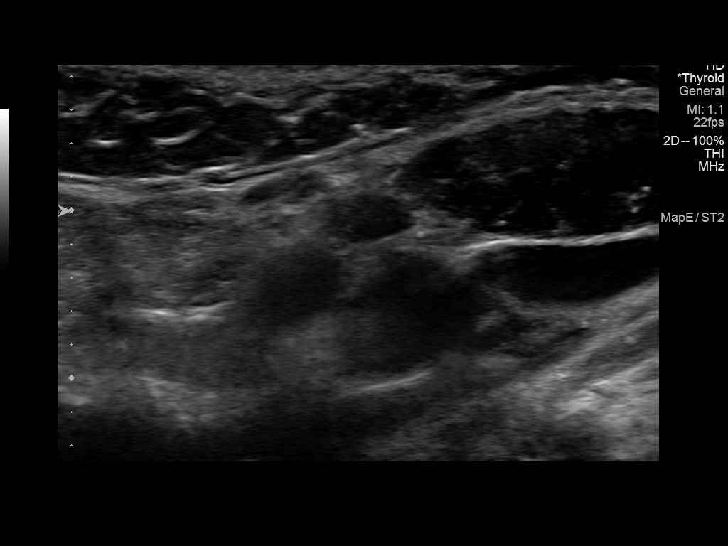
[im 11/12]
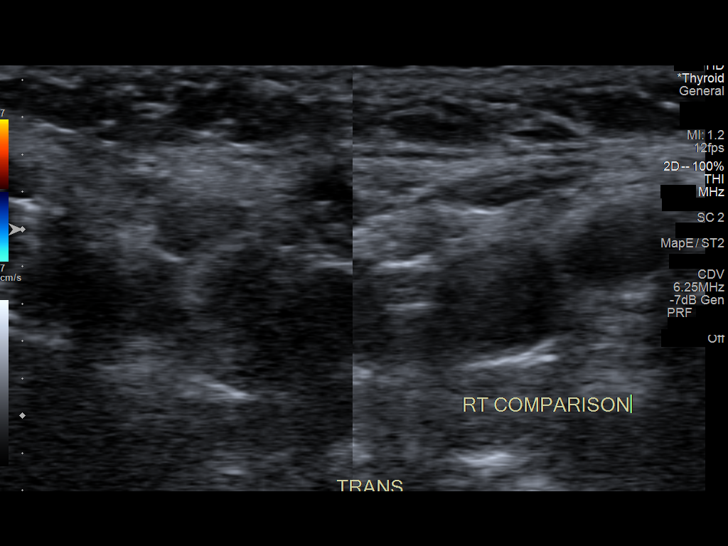
[im 12/12]
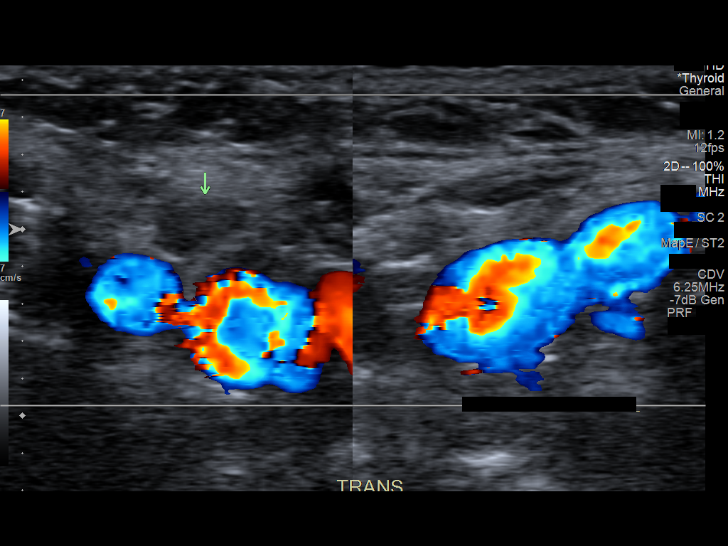

[12 of 12 positions shown; findings below may reference images not displayed]

FINDINGS: Grayscale and color duplex performed in the region of clinical
concern.

No focal fluid or soft tissue lesion.

In the region clinical concern at the left neck there is a 3 mm
hypoechoic focus overlying the jugular vein, with hyperechoic hilum,
most compatible with a lymph node.
IMPRESSION: Sonographic survey demonstrates a typical appearing lymph node in
the region of clinical concern.

## 2021-05-24 DIAGNOSIS — Z800 Family history of malignant neoplasm of digestive organs: Secondary | ICD-10-CM | POA: Diagnosis not present

## 2021-05-24 DIAGNOSIS — K648 Other hemorrhoids: Secondary | ICD-10-CM | POA: Diagnosis not present

## 2021-05-24 DIAGNOSIS — Z1211 Encounter for screening for malignant neoplasm of colon: Secondary | ICD-10-CM | POA: Diagnosis not present

## 2021-05-24 DIAGNOSIS — D125 Benign neoplasm of sigmoid colon: Secondary | ICD-10-CM | POA: Diagnosis not present

## 2021-09-11 DIAGNOSIS — L659 Nonscarring hair loss, unspecified: Secondary | ICD-10-CM | POA: Diagnosis not present

## 2021-09-11 DIAGNOSIS — Z13220 Encounter for screening for lipoid disorders: Secondary | ICD-10-CM | POA: Diagnosis not present

## 2021-09-11 DIAGNOSIS — Z0000 Encounter for general adult medical examination without abnormal findings: Secondary | ICD-10-CM | POA: Diagnosis not present

## 2021-09-27 DIAGNOSIS — H04123 Dry eye syndrome of bilateral lacrimal glands: Secondary | ICD-10-CM | POA: Diagnosis not present

## 2021-09-28 DIAGNOSIS — Z1321 Encounter for screening for nutritional disorder: Secondary | ICD-10-CM | POA: Diagnosis not present

## 2021-09-28 DIAGNOSIS — Z1231 Encounter for screening mammogram for malignant neoplasm of breast: Secondary | ICD-10-CM | POA: Diagnosis not present

## 2021-09-28 DIAGNOSIS — Z130 Encounter for screening for diseases of the blood and blood-forming organs and certain disorders involving the immune mechanism: Secondary | ICD-10-CM | POA: Diagnosis not present

## 2021-09-28 DIAGNOSIS — Z131 Encounter for screening for diabetes mellitus: Secondary | ICD-10-CM | POA: Diagnosis not present

## 2021-09-28 DIAGNOSIS — Z13820 Encounter for screening for osteoporosis: Secondary | ICD-10-CM | POA: Diagnosis not present

## 2021-09-28 DIAGNOSIS — Z1329 Encounter for screening for other suspected endocrine disorder: Secondary | ICD-10-CM | POA: Diagnosis not present

## 2021-09-28 DIAGNOSIS — Z6823 Body mass index (BMI) 23.0-23.9, adult: Secondary | ICD-10-CM | POA: Diagnosis not present

## 2021-09-28 DIAGNOSIS — Z13228 Encounter for screening for other metabolic disorders: Secondary | ICD-10-CM | POA: Diagnosis not present

## 2021-09-28 DIAGNOSIS — Z01419 Encounter for gynecological examination (general) (routine) without abnormal findings: Secondary | ICD-10-CM | POA: Diagnosis not present

## 2021-10-23 DIAGNOSIS — Z23 Encounter for immunization: Secondary | ICD-10-CM | POA: Diagnosis not present

## 2021-11-13 DIAGNOSIS — H2513 Age-related nuclear cataract, bilateral: Secondary | ICD-10-CM | POA: Diagnosis not present

## 2021-11-13 DIAGNOSIS — H04123 Dry eye syndrome of bilateral lacrimal glands: Secondary | ICD-10-CM | POA: Diagnosis not present

## 2021-11-30 DIAGNOSIS — L661 Lichen planopilaris: Secondary | ICD-10-CM | POA: Diagnosis not present

## 2021-11-30 DIAGNOSIS — L821 Other seborrheic keratosis: Secondary | ICD-10-CM | POA: Diagnosis not present

## 2022-03-15 DIAGNOSIS — D2262 Melanocytic nevi of left upper limb, including shoulder: Secondary | ICD-10-CM | POA: Diagnosis not present

## 2022-03-15 DIAGNOSIS — D2261 Melanocytic nevi of right upper limb, including shoulder: Secondary | ICD-10-CM | POA: Diagnosis not present

## 2022-03-15 DIAGNOSIS — L821 Other seborrheic keratosis: Secondary | ICD-10-CM | POA: Diagnosis not present

## 2022-03-15 DIAGNOSIS — D225 Melanocytic nevi of trunk: Secondary | ICD-10-CM | POA: Diagnosis not present

## 2022-04-30 DIAGNOSIS — M79671 Pain in right foot: Secondary | ICD-10-CM | POA: Diagnosis not present

## 2022-04-30 DIAGNOSIS — M79672 Pain in left foot: Secondary | ICD-10-CM | POA: Diagnosis not present

## 2022-05-24 ENCOUNTER — Encounter: Payer: Self-pay | Admitting: Podiatry

## 2022-05-24 ENCOUNTER — Ambulatory Visit (INDEPENDENT_AMBULATORY_CARE_PROVIDER_SITE_OTHER): Payer: BC Managed Care – PPO | Admitting: Podiatry

## 2022-05-24 DIAGNOSIS — D2372 Other benign neoplasm of skin of left lower limb, including hip: Secondary | ICD-10-CM

## 2022-05-24 DIAGNOSIS — D2371 Other benign neoplasm of skin of right lower limb, including hip: Secondary | ICD-10-CM

## 2022-05-24 NOTE — Progress Notes (Signed)
  Subjective:  Patient ID: Erica Robbins, female    DOB: [birth year],   MRN: [id number]  Chief Complaint  Patient presents with   Callouses     Bilateral callus pain   Plantar Warts    Right foot plantars wart    62 y.o. female presents for concern of bilateral calluses on feet and concern for a possible wart on the right foot. Relates she has seen PCP and dermatology and been using wart treatments but the areas just keep coming back. Relates the right side can get quite painful  . Denies any other pedal complaints. Denies n/v/f/c.   History reviewed. No pertinent past medical history.  Objective:  Physical Exam: Vascular: DP/PT pulses 2/4 bilateral. CFT <3 seconds. Normal hair growth on digits. No edema.  Skin. No lacerations or abrasions bilateral feet. Hyperkeartotic cored lesions noted sub first and fifth metatarsals bialteral and sub third metatrsal on the right. Cored areas with disruption of skin lines Musculoskeletal: MMT 5/5 bilateral lower extremities in DF, PF, Inversion and Eversion. Deceased ROM in DF of ankle joint.  Neurological: Sensation intact to light touch.   Assessment:   1. Benign neoplasm of skin of both lower extremities      Plan:  Patient was evaluated and treated and all questions answered. -Discussed  benign lesions of the skin with patient and treatment options.  -Hyperkeratotic tissue was debrided with chisel without incident.  -Applied salycylic acid treatment to area with dressing. Advised to remove bandaging tomorrow.  -Encouraged daily moisturizing -Discussed use of pumice stone -Advised good supportive shoes and inserts -Patient to return to office as needed or sooner if condition worsens.   Louann Sjogren, DPM

## 2022-06-27 DIAGNOSIS — R197 Diarrhea, unspecified: Secondary | ICD-10-CM | POA: Diagnosis not present

## 2022-06-27 DIAGNOSIS — K580 Irritable bowel syndrome with diarrhea: Secondary | ICD-10-CM | POA: Diagnosis not present

## 2022-06-28 DIAGNOSIS — R197 Diarrhea, unspecified: Secondary | ICD-10-CM | POA: Diagnosis not present

## 2022-09-14 DIAGNOSIS — R002 Palpitations: Secondary | ICD-10-CM | POA: Diagnosis not present

## 2022-09-17 DIAGNOSIS — Z13220 Encounter for screening for lipoid disorders: Secondary | ICD-10-CM | POA: Diagnosis not present

## 2022-09-17 DIAGNOSIS — Z0000 Encounter for general adult medical examination without abnormal findings: Secondary | ICD-10-CM | POA: Diagnosis not present

## 2022-10-12 DIAGNOSIS — L659 Nonscarring hair loss, unspecified: Secondary | ICD-10-CM | POA: Diagnosis not present

## 2022-10-12 DIAGNOSIS — Z13220 Encounter for screening for lipoid disorders: Secondary | ICD-10-CM | POA: Diagnosis not present

## 2022-10-12 DIAGNOSIS — Z131 Encounter for screening for diabetes mellitus: Secondary | ICD-10-CM | POA: Diagnosis not present

## 2022-10-12 DIAGNOSIS — Z6822 Body mass index (BMI) 22.0-22.9, adult: Secondary | ICD-10-CM | POA: Diagnosis not present

## 2022-10-12 DIAGNOSIS — Z1231 Encounter for screening mammogram for malignant neoplasm of breast: Secondary | ICD-10-CM | POA: Diagnosis not present

## 2022-10-12 DIAGNOSIS — Z130 Encounter for screening for diseases of the blood and blood-forming organs and certain disorders involving the immune mechanism: Secondary | ICD-10-CM | POA: Diagnosis not present

## 2022-10-12 DIAGNOSIS — Z01419 Encounter for gynecological examination (general) (routine) without abnormal findings: Secondary | ICD-10-CM | POA: Diagnosis not present

## 2022-10-12 DIAGNOSIS — Z1321 Encounter for screening for nutritional disorder: Secondary | ICD-10-CM | POA: Diagnosis not present

## 2022-11-01 DIAGNOSIS — Z23 Encounter for immunization: Secondary | ICD-10-CM | POA: Diagnosis not present

## 2023-04-25 DIAGNOSIS — L820 Inflamed seborrheic keratosis: Secondary | ICD-10-CM | POA: Diagnosis not present

## 2023-04-25 DIAGNOSIS — D2262 Melanocytic nevi of left upper limb, including shoulder: Secondary | ICD-10-CM | POA: Diagnosis not present

## 2023-04-25 DIAGNOSIS — D225 Melanocytic nevi of trunk: Secondary | ICD-10-CM | POA: Diagnosis not present

## 2023-04-25 DIAGNOSIS — L814 Other melanin hyperpigmentation: Secondary | ICD-10-CM | POA: Diagnosis not present

## 2023-04-25 DIAGNOSIS — L821 Other seborrheic keratosis: Secondary | ICD-10-CM | POA: Diagnosis not present

## 2023-07-19 ENCOUNTER — Encounter: Payer: Self-pay | Admitting: Advanced Practice Midwife

## 2023-09-23 DIAGNOSIS — M8588 Other specified disorders of bone density and structure, other site: Secondary | ICD-10-CM | POA: Diagnosis not present

## 2023-09-23 DIAGNOSIS — M85852 Other specified disorders of bone density and structure, left thigh: Secondary | ICD-10-CM | POA: Diagnosis not present

## 2023-09-23 DIAGNOSIS — M8580 Other specified disorders of bone density and structure, unspecified site: Secondary | ICD-10-CM | POA: Diagnosis not present

## 2023-09-23 DIAGNOSIS — Z0000 Encounter for general adult medical examination without abnormal findings: Secondary | ICD-10-CM | POA: Diagnosis not present

## 2023-09-23 DIAGNOSIS — R002 Palpitations: Secondary | ICD-10-CM | POA: Diagnosis not present

## 2023-09-23 DIAGNOSIS — Z13220 Encounter for screening for lipoid disorders: Secondary | ICD-10-CM | POA: Diagnosis not present

## 2023-10-15 DIAGNOSIS — Z1231 Encounter for screening mammogram for malignant neoplasm of breast: Secondary | ICD-10-CM | POA: Diagnosis not present

## 2023-10-15 DIAGNOSIS — Z01419 Encounter for gynecological examination (general) (routine) without abnormal findings: Secondary | ICD-10-CM | POA: Diagnosis not present

## 2023-10-15 DIAGNOSIS — Z6822 Body mass index (BMI) 22.0-22.9, adult: Secondary | ICD-10-CM | POA: Diagnosis not present

## 2023-11-06 DIAGNOSIS — Z23 Encounter for immunization: Secondary | ICD-10-CM | POA: Diagnosis not present
